# Patient Record
Sex: Male | Born: 1980 | Race: White | Hispanic: No
Health system: Southern US, Community
[De-identification: ages and names within clinical notes are randomized; demographics above are authoritative.]

---

## 2009-02-09 ENCOUNTER — Encounter: Admission: RE | Admit: 2009-02-09 | Discharge: 2009-02-09 | Payer: Self-pay | Admitting: Occupational Medicine

## 2009-09-15 IMAGING — CR DG CHEST 1V
1 series · 1 of 1 positions shown · non-contrast
Comparison: None

CLINICAL DATA: Pre employment physical

CHEST - 1 VIEW

[view not recorded]
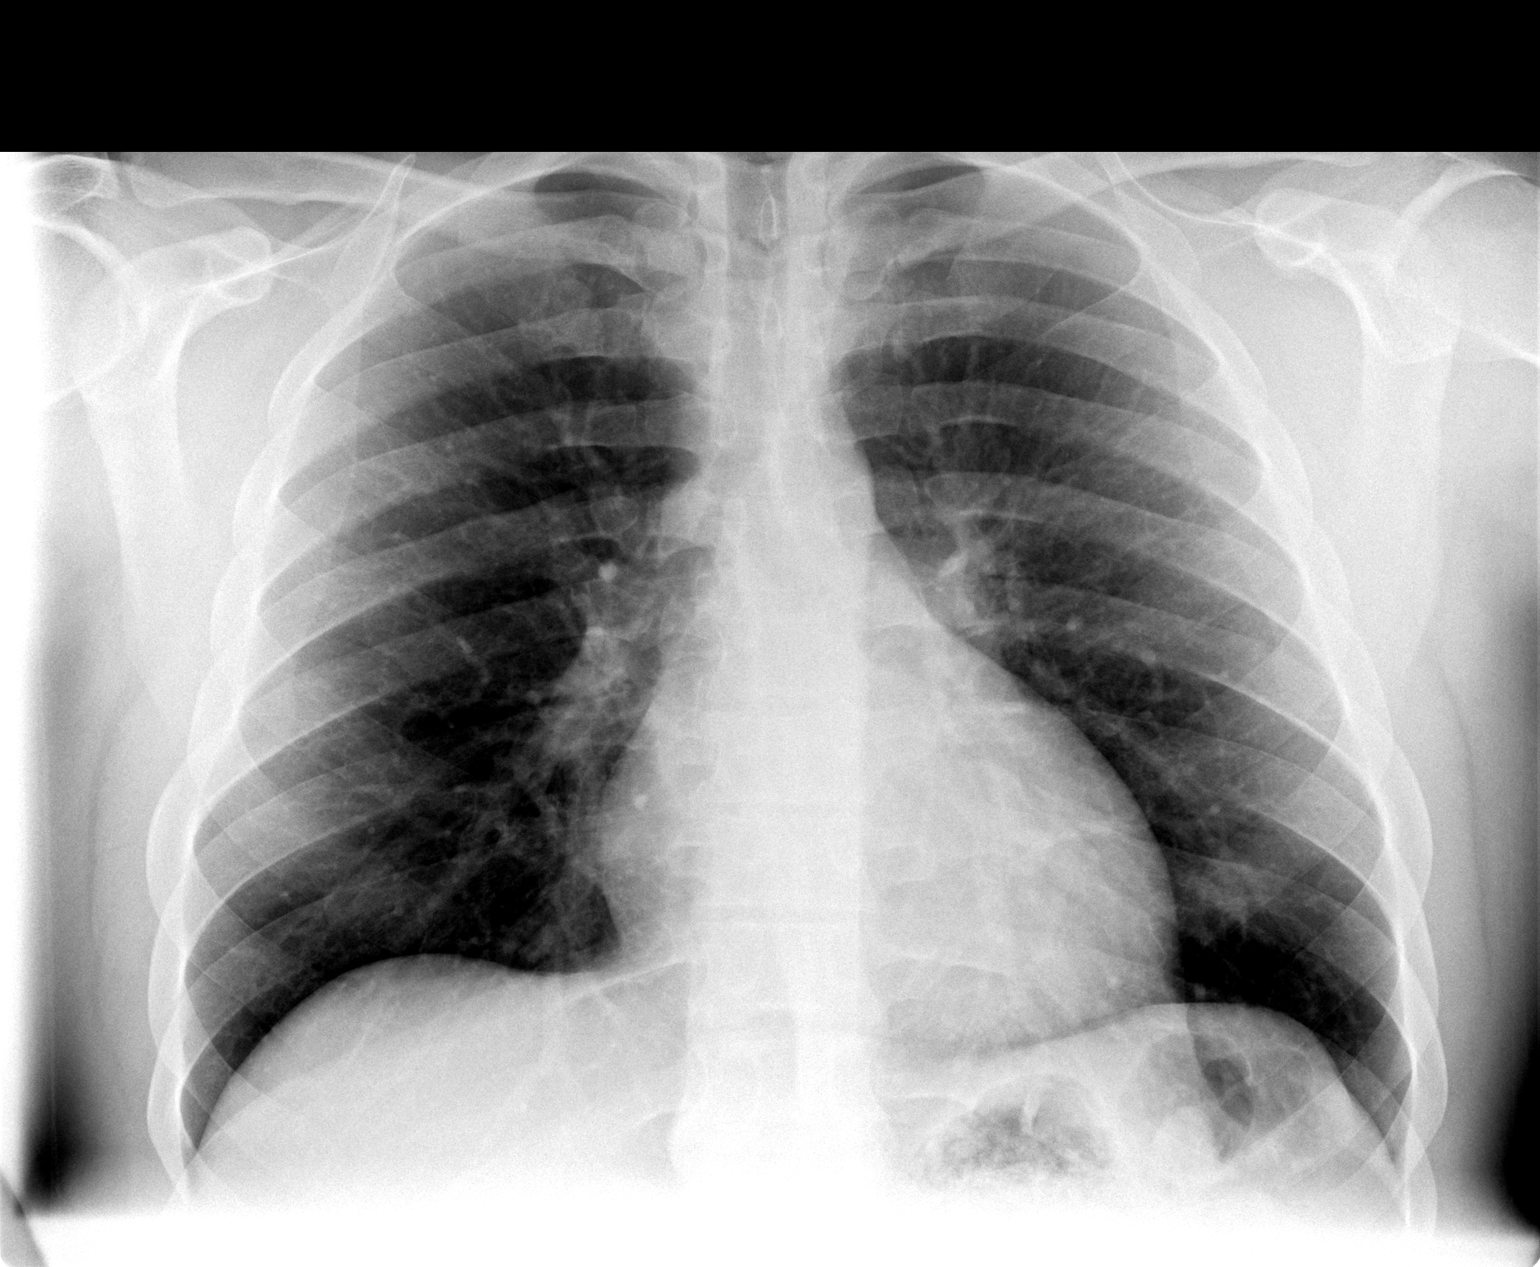

[1 of 1 positions shown; findings below may reference images not displayed]

FINDINGS: The heart size and mediastinal contours are within normal
limits.  Both lungs are clear.  The visualized skeletal structures
are unremarkable.
IMPRESSION: No acute findings.

## 2017-12-30 DIAGNOSIS — J00 Acute nasopharyngitis [common cold]: Secondary | ICD-10-CM | POA: Diagnosis not present

## 2017-12-30 DIAGNOSIS — J069 Acute upper respiratory infection, unspecified: Secondary | ICD-10-CM | POA: Diagnosis not present

## 2018-02-15 DIAGNOSIS — L237 Allergic contact dermatitis due to plants, except food: Secondary | ICD-10-CM | POA: Diagnosis not present

## 2018-07-30 DIAGNOSIS — L03119 Cellulitis of unspecified part of limb: Secondary | ICD-10-CM | POA: Diagnosis not present

## 2018-08-25 DIAGNOSIS — L237 Allergic contact dermatitis due to plants, except food: Secondary | ICD-10-CM | POA: Diagnosis not present

## 2018-09-09 DIAGNOSIS — L237 Allergic contact dermatitis due to plants, except food: Secondary | ICD-10-CM | POA: Diagnosis not present

## 2019-05-09 DIAGNOSIS — Z0000 Encounter for general adult medical examination without abnormal findings: Secondary | ICD-10-CM | POA: Diagnosis not present

## 2019-06-06 DIAGNOSIS — Z13220 Encounter for screening for lipoid disorders: Secondary | ICD-10-CM | POA: Diagnosis not present

## 2019-06-06 DIAGNOSIS — Z131 Encounter for screening for diabetes mellitus: Secondary | ICD-10-CM | POA: Diagnosis not present

## 2019-08-16 DIAGNOSIS — L720 Epidermal cyst: Secondary | ICD-10-CM | POA: Diagnosis not present

## 2019-11-20 DIAGNOSIS — Z20828 Contact with and (suspected) exposure to other viral communicable diseases: Secondary | ICD-10-CM | POA: Diagnosis not present

## 2019-11-20 DIAGNOSIS — U071 COVID-19: Secondary | ICD-10-CM | POA: Diagnosis not present

## 2019-11-20 DIAGNOSIS — Z03818 Encounter for observation for suspected exposure to other biological agents ruled out: Secondary | ICD-10-CM | POA: Diagnosis not present

## 2021-04-22 ENCOUNTER — Other Ambulatory Visit: Payer: Self-pay

## 2021-04-22 ENCOUNTER — Emergency Department (HOSPITAL_BASED_OUTPATIENT_CLINIC_OR_DEPARTMENT_OTHER): Payer: BC Managed Care – PPO | Admitting: Radiology

## 2021-04-22 ENCOUNTER — Emergency Department (HOSPITAL_BASED_OUTPATIENT_CLINIC_OR_DEPARTMENT_OTHER)
Admission: EM | Admit: 2021-04-22 | Discharge: 2021-04-22 | Disposition: A | Discharge status: Home / Self Care | Payer: BC Managed Care – PPO | Attending: Emergency Medicine | Admitting: Emergency Medicine

## 2021-04-22 DIAGNOSIS — M25571 Pain in right ankle and joints of right foot: Secondary | ICD-10-CM | POA: Diagnosis not present

## 2021-04-22 DIAGNOSIS — M25579 Pain in unspecified ankle and joints of unspecified foot: Secondary | ICD-10-CM

## 2021-04-22 DIAGNOSIS — Y9339 Activity, other involving climbing, rappelling and jumping off: Secondary | ICD-10-CM | POA: Diagnosis not present

## 2021-04-22 DIAGNOSIS — X501XXA Overexertion from prolonged static or awkward postures, initial encounter: Secondary | ICD-10-CM | POA: Diagnosis not present

## 2021-04-22 DIAGNOSIS — M7989 Other specified soft tissue disorders: Secondary | ICD-10-CM | POA: Diagnosis not present

## 2021-04-22 MED ORDER — IBUPROFEN 800 MG PO TABS
800.0000 mg | ORAL_TABLET | Freq: Once | ORAL | Status: AC
  Administered 2021-04-22: 800 mg via ORAL
  Filled 2021-04-22: qty 1

## 2021-04-22 NOTE — Discharge Instructions (Addendum)
Wear the cam boot at all times.  Call orthopedics for follow-up.  Concern is Achilles tendon injury.

## 2021-04-22 NOTE — ED Provider Notes (Signed)
[facility] EMERGENCY DEPT Provider Note   CSN: [id number] Arrival date & time: 04/22/21  2706     History Chief Complaint  Patient presents with   Ankle Pain    Gabriel Spears is a 40 y.o. male.  HPI     This is a 40 year old male with no reported past medical history who presents with right ankle and leg pain.  Patient reports he was attempting to jump backwards off a diving board.  He jumped up and came back down onto the diving board awkwardly on the ball of his foot.  Since that time he has had pain in the medial and posterior aspect of the ankle.  He has iced and taken Tylenol.  Currently his pain is controlled when he has not using his ankle.  He denies other injury.  No past medical history on file.  There are no problems to display for this patient.    No family history on file.     Home Medications Prior to Admission medications   Not on File    Allergies    Patient has no known allergies.  Review of Systems   Review of Systems  Musculoskeletal:        Ankle injury  Skin:  Negative for wound.  All other systems reviewed and are negative.  Physical Exam Updated Vital Signs BP 131/87 (BP Location: Right Arm)   Pulse 70   Temp 97.7 F (36.5 C) (Oral)   Resp 15   Ht 1.829 m (6')   Wt 106.6 kg   SpO2 98%   BMI 31.87 kg/m   Physical Exam Vitals and nursing note reviewed.  Constitutional:      Appearance: He is well-developed.  HENT:     Head: Normocephalic and atraumatic.     Nose: Nose normal.     Mouth/Throat:     Mouth: Mucous membranes are moist.  Eyes:     Pupils: Pupils are equal, round, and reactive to light.  Cardiovascular:     Rate and Rhythm: Normal rate and regular rhythm.  Pulmonary:     Effort: Pulmonary effort is normal. No respiratory distress.  Abdominal:     Palpations: Abdomen is soft.  Musculoskeletal:     Cervical back: Neck supple.     Comments: Slight swelling noted over the posterior medial aspect of  the ankle, patient can actively flex and extend at the ankle joint although range of motion seems limited, Thompson test intact; however when compared to the contralateral side, extent of plantarflexion is limited  Lymphadenopathy:     Cervical: No cervical adenopathy.  Skin:    General: Skin is warm and dry.  Neurological:     Mental Status: He is alert and oriented to person, place, and time.  Psychiatric:        Mood and Affect: Mood normal.    ED Results / Procedures / Treatments   Labs (all labs ordered are listed, but only abnormal results are displayed) Labs Reviewed - No data to display  EKG None  Radiology No results found.  Procedures Procedures   Medications Ordered in ED Medications  ibuprofen (ADVIL) tablet 800 mg (has no administration in time range)    ED Course  I have reviewed the triage vital signs and the nursing notes.  Pertinent labs & imaging results that were available during my care of the patient were reviewed by me and considered in my medical decision making (see chart for details).  MDM Rules/Calculators/A&P                          Patient presents with right ankle injury.  He is nontoxic and vital signs are reassuring.  Considerations include fracture, ankle sprain, Achilles injury.  While [place] test is technically intact, his range of motion and degree of plantarflexion seem limited especially when compared to the other side.  Given location of pain, occult Achilles injury or partial tear cannot be excluded.  Sprain is also consideration.  X-rays obtained.  These are pending.  Discussed with the patient that regardless of x-ray findings, would place him in a nonweightbearing status and follow-up with orthopedics for formal evaluation for Achilles injury.  He will be provided with crutches and a cam walker.  Patient signed out to oncoming provider.  Final Clinical Impression(s) / ED Diagnoses Final diagnoses:  Ankle pain    Rx / DC  Orders ED Discharge Orders     None        Shakeerah Gradel, Mayer Masker, MD 04/22/21 [phone]

## 2021-04-22 NOTE — ED Notes (Signed)
Verbally explained D/C instructions to pt/wife. Both verbalized understanding.

## 2021-04-22 NOTE — ED Provider Notes (Signed)
Patient x-ray of the right ankle shows a small avulsion fracture on the dorsal aspect of the anterior talus.  No other fractures noted.  Clinically Dr.Horton felt that patient was concerns for an acute Achilles injury.  Treated with cam boot follow-up with orthopedics.   Vanetta Mulders, MD 04/22/21 [phone]

## 2021-04-22 NOTE — ED Triage Notes (Signed)
Pt c/o of right ankle pain after jumping off of diving board. Some swelling is noted.

## 2021-04-23 DIAGNOSIS — S92154A Nondisplaced avulsion fracture (chip fracture) of right talus, initial encounter for closed fracture: Secondary | ICD-10-CM | POA: Diagnosis not present

## 2024-05-23 NOTE — Progress Notes (Signed)
 Assessment and Plan   1. Well adult exam (Primary) -     Hepatitis C Antibody; Future; Expected date: 05/23/2024 -     CBC And Differential; Future; Expected date: 05/23/2024 -     Comprehensive Metabolic Panel; Future; Expected date: 05/23/2024 -     Lipid panel ([facility]); Future; Expected date: 05/23/2024 -     TSH; Future; Expected date: 05/23/2024 -     PSA; Future; Expected date: 05/23/2024 -     Ambulatory referral to Cardiology 2. Bradycardia with 41-50 beats per minute -     ECG 12 lead; Future 3. Need for tetanus booster -     Tdap vaccine greater than or equal to 10yo IM (Boostrix) 4. Diabetes mellitus screening -     Comprehensive Metabolic Panel; Future; Expected date: 05/23/2024 5. Thyroid disorder screen -     TSH; Future; Expected date: 05/23/2024 6. Lipid screening -     Lipid panel ([facility]); Future; Expected date: 05/23/2024 7. Screening for disorder of blood and blood-forming organs -     CBC And Differential; Future; Expected date: 05/23/2024 8. Prostate cancer screening -     PSA; Future; Expected date: 05/23/2024 9. Need for hepatitis C screening test -     Hepatitis C Antibody; Future; Expected date: 05/23/2024 10. Chronic bilateral low back pain without sciatica -     Ambulatory referral to Physical Therapy Evaluation and Treatment   - Physical activity: Current physical activity level is inadequate. Provided information about increasing physical exercise.     - Unhealthy alcohol use screening: No unhealthy alcohol use identified.          - Colon cancer screening: Not indicated                                 - STI screening: STI screening was not indicated today. Information about STI screening provided on AVS. - Hepatitis C screening: One-time hepatitis C screen ordered today       - Hypertension screening: Blood pressure of 126/82 is well controlled.                          - Statins for ASCVD risk reduction: A fasting  lipid panel was ordered today  - HIV pre-exposure prophylaxis: Not indicated         EKG today due to bradycardia at 43 Tetanus booster today  Fiber gummies to prevent hemorrhoids.  He has had trouble with this for over 10 years   last colonoscopy was 05/17/2014 Will check CBC to make sure hemorrhoids are not causing too much blood loss   Risks, benefits, and alternatives of the medications and treatment plan prescribed today were discussed, and patient expressed understanding. Plan follow-up as discussed or as needed if any worsening symptoms or change in condition.     Subjective      Patient presents with  . Establish Care     Additional issues addressed beyond the scope of the wellness visit: low back pain   Patient history from pre-visit questionnaire Is there anything specific you would like to address with your healthcare provider today?: low back Have you been sexually active since your last wellness visit (physical)?: Yes - Women Have you had a new sexual partner since your last wellenss visit (physical)?: No Do you live with or have sex with a person with hepatitis B (risk factors for  hepatitis B)?: No Did you have a blood transfusion before 1992?: No Do you ever use illegal injection drugs (other than medications your doctor has prescribed like insulin and vaccines).: No Were you born in sub-Saharan [place], [place], [place] or [place] (countries with a large percentage of people with hepatitis B)?: No Were you born in [place], the [place], [place], [place], [place], [place] or [place] (countries with high rates of tuberculosis)?: No In the last year, have you lived in a long-term care facility (nursing home), correctional facility or were homeless?: No Do you wear a seatbelt when you ride in a car?: Always   Reviewed and updated this visit by provider: Tobacco  Allergies  Meds  Problems  Med Hx  Surg Hx  Fam Hx      Review of systems: Constitutional:  (-) Fatigue, (-) Unexpected weight change, (-) Fever, (-) Appetite change ENT:  (-) Tinnitus, (-) Trouble swallowing Eyes: (-) Photophobia, (-) Visual disturbance Lungs: (-) Cough, (-) Shortness of breath, (-) Wheezing,(-) Hemoptysis Heart: (-) Chest pain, (-) Leg swelling, (-) Palpitations Gastrointestinal: (-) Abdominal pain, (-) Nausea,  (-) Constipation, (-) Diarrhea,  (+) Blood in stool Endocrine: (-) Polydipsia (-) Polyuria Genitourinary: (-) Urinary urgency,  (-) Dyspareunia,  (-) Difficulty urinating Musculoskeletal: (-) Arthralgia,  (-) Myalgia Skin: (-) Rash,  (-) Skin color change,  (-) Wound Allergy: (+) Environmental allergies (-) Food allergies Neurologic: (-) Weakness, (-) Numbness,  (-) Sleep disturbance, (-) Headache Hematology: (-) Adenopathy,  (-) Easy bruising or bleeding Psychiatric: (-) Dysphoria,  (-) Anxious Objective   Vitals:   05/23/24 1048  BP: 126/82  Patient Position: Sitting  Pulse: (!) 43  Temp: 97.9 F (36.6 C)  TempSrc: Oral  Resp: 16  Height: 6' (1.829 m)  Weight: 232 lb (105.2 kg)  SpO2: 99%  BMI (Calculated): 31.5  PainSc: 0-No pain    Physical Exam Vitals and nursing note reviewed.  Constitutional:      General: He is not in acute distress.    Appearance: Normal appearance. He is well-developed. He is not ill-appearing, toxic-appearing or diaphoretic.  HENT:     Head: Normocephalic and atraumatic.     Right Ear: External ear normal.     Left Ear: External ear normal.     Nose: Nose normal.  Eyes:     Pupils: Pupils are equal, round, and reactive to light.  Neck:     Thyroid: No thyromegaly.     Vascular: No carotid bruit.     Trachea: No tracheal deviation.  Cardiovascular:     Rate and Rhythm: Normal rate and regular rhythm.     Heart sounds: Normal heart sounds. No murmur heard.    No friction rub. No gallop.  Musculoskeletal:     Cervical back: Normal range of motion and neck supple.  Pulmonary:     Effort: Pulmonary  effort is normal. No respiratory distress.     Breath sounds: Normal breath sounds. No wheezing.  Abdominal:     General: Bowel sounds are normal. There is no distension.     Palpations: Abdomen is soft. There is no mass.     Tenderness: There is no abdominal tenderness. There is no guarding or rebound.     Hernia: No hernia is present.  Lymphadenopathy:     Cervical: No cervical adenopathy.  Skin:    General: Skin is warm and dry.     Coloration: Skin is not pale.     Findings: No erythema or rash.  Neurological:     Mental Status: He is alert and oriented to person, place, and time.     Gait: Gait normal.     Deep Tendon Reflexes: Reflexes are normal and symmetric.  Psychiatric:        Mood and Affect: Mood normal.        Behavior: Behavior normal.        Thought Content: Thought content normal.        Judgment: Judgment normal.    SDoH Screening Completed SDoH screening was reviewed as documented with appropriate diagnoses added based on screening.  Time spent performing SDoH screening, documenting as appropriate, and entering intervention when necessary: 5 minutes or more

## 2024-05-24 NOTE — Progress Notes (Signed)
 Cholesterol slightly high   look at a mediterranean diet and will repeat next year Total bilirubin high with normal liver function test   Will follow and repeat in a year Thyroid normal Hep C neg PSA normal

## 2024-06-28 NOTE — Progress Notes (Signed)
 Outpatient Cardiology Consultation   HPI    History of Present Illness:  Gabriel Spears is a 43 y.o. male   History of Present Illness The patient is a 43 year old male presenting for bradycardia. During a routine checkup at his primary care physician's office, sinus bradycardia with a heart rate of 39 bpm, first-degree AV block, and normal QRS duration were noted. He is accompanied by his wife.  He was recently informed of a low heart rate during a visit to his primary care physician, which was not the first instance. He recalls a similar incident in 2018 when he consulted a cardiologist at [facility]. Over the past decade, he has sought regular medical attention but does not frequently visit doctors. He reports no fainting episodes but admits to occasional dizziness and lightheadedness, which do not cause him significant concern. His physical activity includes yard work, walking, and playing pickleball, but he does not engage in strenuous exercise. He reports feeling well during these activities, although he occasionally felt overworked during the summer due to the heat. His wife notes that he has been expressing fatigue recently, which is unusual for him. He reports no chest pain during physical exertion. He has no history of smoking and consumes alcohol once or twice a week, limiting himself to 1 or 2 drinks, with a maximum of 3. His current regimen includes vitamins, fiber gummies, and probiotics. He reports no leg or foot swelling but mentions occasional swelling in his right ankle due to a previous injury. He has been engaging in weightlifting exercises for the past few weeks.  SOCIAL HISTORY - Engages in activities such as yard work, occasional walking, and playing pickleball - Recently started doing 30 minutes of weights and lifting own body weight - Drinks alcohol once or twice a week, consuming one to three drinks per session - Does not smoke  FAMILY HISTORY - Mother: Regularly low heart  rates (40s to 63s), massive heart attack at age 33 - Father: History of heart failure and diabetes    Exam  Heart Rate:  [60] 60 BP: (120)/(70) 120/70 SpO2:  [99 %] 99 %  Review of Systems  Constitutional:  Negative for chills and fever.  Respiratory:  Negative for chest tightness and shortness of breath.   Cardiovascular:  Negative for chest pain, palpitations and leg swelling.  Musculoskeletal:  Negative for arthralgias and joint swelling.  Neurological:  Negative for dizziness, syncope and light-headedness.    Physical Exam Constitutional:      Appearance: Normal appearance.  HENT:     Head: Normocephalic and atraumatic.  Cardiovascular:     Rate and Rhythm: Normal rate and regular rhythm.     Heart sounds: No murmur heard.    No gallop.  Musculoskeletal:     Right lower leg: No edema.     Left lower leg: No edema.  Pulmonary:     Effort: Pulmonary effort is normal.     Breath sounds: No wheezing.  Skin:    General: Skin is warm and dry.  Neurological:     Mental Status: He is alert.       Assessment/Plan   I have personally reviewed the patients active medications.  I have reviewed prior PCP note(s) Prior studies independently reviewed and interpreted by me:  EKG - my read: Sinus brady, 1st degree av block.  Assessment:  I have personally reviewed the following results:  Results - Labs:   - LDL cholesterol: 05/2024, 861 mg/dL  - Diagnostic Testing:   -  EKG: Sinus bradycardia, heart rate 39 bpm, first degree AV block, normal QRS duration    Assessment & Plan 1. Bradycardia: Stable. Asymptomatic   Heart rate recorded at 60 bpm today; EKG from primary care physician's office showed a heart rate of 40 bpm.   No medications causing the issue.  Unlikely to require a pacemaker in the near future given age and current symptoms. Overall cardiovascular risk is not high based on available information. - Echocardiogram ordered to ensure structural integrity of the  heart. - Engage in regular exercise for at least 30 minutes most days of the week. - Maintain a healthy weight. - Avoid smoking and excessive alcohol consumption. - Adhere to a balanced diet rich in fruits, vegetables, lean proteins, and low in junk food and sweets. - Monitor for symptoms such as frequent dizziness, lightheadedness, or fainting spells and seek immediate medical attention if they occur.  2. Elevated LDL cholesterol: LDL cholesterol level recorded at 138 last month. Not within the concerning range given the absence of other major risk factors. - Lifestyle modifications recommended: regular exercise and a balanced diet. - Monitor LDL levels over time; if levels continue to rise, further treatment options will be considered.  Follow-up: - Scheduled for 6 months from now.        Electronically Signed: Redell DELENA Montenegro, MD 06/28/2024 10:37 AM

## 2024-09-26 ENCOUNTER — Ambulatory Visit: Payer: Self-pay | Admitting: Surgery

## 2024-09-26 NOTE — H&P (Signed)
 Subjective    Chief Complaint: New Consultation (possible hernia)       History of Present Illness: Gabriel Spears is a 43 y.o. male who is seen today as an office consultation at the request of Abigail Brinks for evaluation of New Consultation (possible hernia) .   This is a 43 year old male in good health who presents after recently noticing some asymmetry with a bulging in his right groin.  This bulge is reducible when he is supine.  Minimal discomfort.  Recently, he started working out again.  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  His current job does not require heavy lifting.  He denies any obstructive symptoms.   Review of Systems: A complete review of systems was obtained from the patient.  I have reviewed this information and discussed as appropriate with the patient.  See HPI as well for other ROS.   Review of Systems  Constitutional: Negative.   HENT: Negative.    Eyes: Negative.   Respiratory: Negative.    Cardiovascular: Negative.   Gastrointestinal: Negative.   Genitourinary: Negative.   Musculoskeletal: Negative.   Skin: Negative.   Neurological: Negative.   Endo/Heme/Allergies: Negative.   Psychiatric/Behavioral: Negative.          Medical History: Past Medical History  History reviewed. No pertinent past medical history.     Problem List     Patient Active Problem List  Diagnosis   Abdominal cramping   Closed nondisplaced avulsion fracture of right talus   Ulnar neuropathy of right upper extremity   Right inguinal hernia        Past Surgical History  History reviewed. No pertinent surgical history.      Allergies  No Known Allergies     Medications Ordered Prior to Encounter  No current outpatient medications on file prior to visit.    No current facility-administered medications on file prior to visit.        Family History       Family History  Problem Relation Age of Onset   Coronary Artery Disease  (Blocked arteries around heart) Mother     Obesity Mother     High blood pressure (Hypertension) Mother     Hyperlipidemia (Elevated cholesterol) Mother          Tobacco Use History  Social History       Tobacco Use  Smoking Status Never  Smokeless Tobacco Never        Social History  Social History         Socioeconomic History   Marital status: Married  Tobacco Use   Smoking status: Never   Smokeless tobacco: Never  Vaping Use   Vaping status: Unknown  Substance and Sexual Activity   Alcohol use: Yes      Alcohol/week: 0.0 - 1.0 standard drinks of alcohol   Drug use: Never    Social Drivers of Acupuncturist Strain: Low Risk (05/23/2024)    Received from [facility]    Overall Financial Resource Strain (CARDIA)     How hard is it for you to pay for the very basics like food, housing, medical care, and heating?: Not hard at all  Food Insecurity: No Food Insecurity (05/23/2024)    Received from [facility]    Hunger Vital Sign     Within the past 12 months, you worried that your food would run out before you got the money to buy  more.: Never true     Within the past 12 months, the food you bought just didn't last and you didn't have money to get more.: Never true  Transportation Needs: No Transportation Needs (05/23/2024)    Received from [facility] - Transportation     In the past 12 months, has lack of transportation kept you from medical appointments or from getting medications?: No     In the past 12 months, has lack of transportation kept you from meetings, work, or from getting things needed for daily living?: No  Physical Activity: Insufficiently Active (05/23/2024)    Received from [facility]    Exercise Vital Sign     On average, how many days per week do you engage in moderate to strenuous exercise (like a brisk walk)?: 4 days     On average, how many minutes do you engage in exercise at this level?: 20 min  Stress: No  Stress Concern Present (05/23/2024)    Received from [facility] - Occupational Stress Questionnaire     Do you feel stress - tense, restless, nervous, or anxious, or unable to sleep at night because your mind is troubled all the time - these days?: Not at all  Social Connections: Socially Integrated (05/23/2024)    Received from [facility]    Social Network     How would you rate your social network (family, work, friends)?: Good participation with social networks  Housing Stability: Unknown (09/26/2024)    Housing Stability Vital Sign     Homeless in the Last Year: No        Objective:          Vitals:    09/26/24 1025 09/26/24 1027  BP: 130/78    Pulse: 78    Resp: 16    Temp: 37.1 C (98.7 F)    SpO2: 99%    Weight: (!) 106.8 kg (235 lb 6.4 oz)    Height: 182.9 cm (6')    PainSc:   0-No pain    Body mass index is 31.93 kg/m.   Physical Exam    Constitutional:  WDWN in NAD, conversant, no obvious deformities; lying in bed comfortably Eyes:  Pupils equal, round; sclera anicteric; moist conjunctiva; no lid lag HENT:  Oral mucosa moist; good dentition  Neck:  No masses palpated, trachea midline; no thyromegaly Lungs:  CTA bilaterally; normal respiratory effort Breasts:  symmetric, no nipple changes; no palpable masses or lymphadenopathy on either side CV:  Regular rate and rhythm; no murmurs; extremities well-perfused with no edema Abd:  +bowel sounds, soft, non-tender, no palpable organomegaly; no palpable hernias Musc: Normal gait; no apparent clubbing or cyanosis in extremities GU: Bilateral descended testes, no testicular masses, visible reducible right inguinal hernia.  No sign of left inguinal hernia. Lymphatic:  No palpable cervical or axillary lymphadenopathy Skin:  Warm, dry; no sign of jaundice Psychiatric - alert and oriented x 4; calm mood and affect   Assessment and Plan:  Diagnoses and all orders for this  visit:   Right inguinal hernia     Recommend right inguinal hernia repair with mesh.The surgical procedure has been discussed with the patient.  Potential risks, benefits, alternative treatments, and expected outcomes have been explained.  All of the patient's questions at this time have been answered.  The likelihood of reaching the patient's treatment goal is good.  The patient understands the proposed surgical procedure  and wishes to proceed.   Azam Gervasi DEWAYNE LIMA, MD  09/26/2024 10:53 AM
